# Patient Record
Sex: Male | Born: 1975 | Race: White | Hispanic: No | Smoking: Never smoker
Health system: Southern US, Community
[De-identification: ages and names within clinical notes are randomized; demographics above are authoritative.]

## PROBLEM LIST (undated history)

## (undated) DIAGNOSIS — I10 Essential (primary) hypertension: Secondary | ICD-10-CM

## (undated) DIAGNOSIS — E669 Obesity, unspecified: Secondary | ICD-10-CM

## (undated) DIAGNOSIS — E66811 Obesity, class 1: Secondary | ICD-10-CM

## (undated) DIAGNOSIS — K219 Gastro-esophageal reflux disease without esophagitis: Secondary | ICD-10-CM

## (undated) DIAGNOSIS — F410 Panic disorder [episodic paroxysmal anxiety] without agoraphobia: Secondary | ICD-10-CM

## (undated) HISTORY — DX: Obesity, class 1: E66.811

## (undated) HISTORY — DX: Gastro-esophageal reflux disease without esophagitis: K21.9

## (undated) HISTORY — DX: Panic disorder (episodic paroxysmal anxiety): F41.0

## (undated) HISTORY — DX: Obesity, unspecified: E66.9

## (undated) HISTORY — DX: Essential (primary) hypertension: I10

---

## 2007-05-19 HISTORY — PX: KNEE ARTHROSCOPY: SHX127

## 2021-06-05 DIAGNOSIS — K219 Gastro-esophageal reflux disease without esophagitis: Secondary | ICD-10-CM | POA: Insufficient documentation

## 2021-06-05 DIAGNOSIS — E669 Obesity, unspecified: Secondary | ICD-10-CM | POA: Insufficient documentation

## 2021-06-05 DIAGNOSIS — F411 Generalized anxiety disorder: Secondary | ICD-10-CM | POA: Insufficient documentation

## 2021-06-05 DIAGNOSIS — I10 Essential (primary) hypertension: Secondary | ICD-10-CM | POA: Insufficient documentation

## 2021-06-05 DIAGNOSIS — F410 Panic disorder [episodic paroxysmal anxiety] without agoraphobia: Secondary | ICD-10-CM | POA: Insufficient documentation

## 2021-06-10 ENCOUNTER — Encounter: Payer: Self-pay | Admitting: Cardiology

## 2021-06-10 ENCOUNTER — Other Ambulatory Visit: Payer: Self-pay

## 2021-06-10 ENCOUNTER — Ambulatory Visit: Payer: BC Managed Care – PPO | Admitting: Cardiology

## 2021-06-10 VITALS — BP 148/108 | HR 94 | Ht 68.0 in | Wt 245.0 lb

## 2021-06-10 DIAGNOSIS — R0602 Shortness of breath: Secondary | ICD-10-CM

## 2021-06-10 DIAGNOSIS — Z8249 Family history of ischemic heart disease and other diseases of the circulatory system: Secondary | ICD-10-CM

## 2021-06-10 DIAGNOSIS — R072 Precordial pain: Secondary | ICD-10-CM | POA: Diagnosis not present

## 2021-06-10 DIAGNOSIS — I10 Essential (primary) hypertension: Secondary | ICD-10-CM

## 2021-06-10 DIAGNOSIS — E782 Mixed hyperlipidemia: Secondary | ICD-10-CM | POA: Diagnosis not present

## 2021-06-10 MED ORDER — ASPIRIN EC 81 MG PO TBEC: 81.0000 mg | DELAYED_RELEASE_TABLET | Freq: Every day | ORAL | 3 refills | Status: DC

## 2021-06-10 MED ORDER — METOPROLOL TARTRATE 100 MG PO TABS: 100.0000 mg | ORAL_TABLET | Freq: Once | ORAL | 0 refills | Status: DC

## 2021-06-10 NOTE — Progress Notes (Signed)
Cardiology Office Note:    Date:  06/10/2021   ID:  Roger Vance, DOB [birth year], MRN [id number]  PCP:  Julianne Handler, NP  Cardiologist:  Norman Herrlich, MD   Referring MD: Julianne Handler, NP  ASSESSMENT:    1. Precordial pain   2. SOB (shortness of breath) on exertion   3. Essential hypertension   4. Mixed hyperlipidemia   5. Family history of premature CAD    PLAN:    In order of problems listed above:  Although his pretest probability of CAD by traditional ASCVD risk calculator is low it does not incorporate his family history.  His symptoms seem to be typical anginal I think he should have further evaluation including echocardiogram for cardio myopathy although he does not have a murmur and cardiac CTA for CAD and calcium score.  He will also have evaluation of his lungs on CT scan. Continue his current antihypertensives he is quite hypertensive today but did not take his medication he received a beta-blocker for the CT scan  Continue lipid-lowering likely will need a second agent along with statin in a male population and use prescription strength fish oil Lovaza.  If CAD is significant icosapent ethyl  Next appointment 8 weeks    Medication Adjustments/Labs and Tests Ordered: Current medicines are reviewed at length with the patient today.  Concerns regarding medicines are outlined above.  Orders Placed This Encounter  Procedures   CT CORONARY MORPH W/CTA COR W/SCORE W/CA W/CM &/OR WO/CM   Basic metabolic panel   EKG 12-Lead   ECHOCARDIOGRAM COMPLETE   Meds ordered this encounter  Medications   aspirin EC 81 MG tablet    Sig: Take 1 tablet (81 mg total) by mouth daily. Swallow whole.    Dispense:  90 tablet    Refill:  3   metoprolol tartrate (LOPRESSOR) 100 MG tablet    Sig: Take 1 tablet (100 mg total) by mouth once for 1 dose. Take two hours prior to your cardiac CT    Dispense:  1 tablet    Refill:  0     Chief Complaint  Patient presents with   Shortness  of Breath    History of Present Illness:    Roger Vance is a 46 y.o. male with a history of hypertension dyslipidemia currently not on lipid-lowering treatment and anxiety disorder who is being seen today for the evaluation of exertional shortness of breath at the request of Julianne Handler, NP.  Recent labs 04/09/2020 hemoglobin 16.9 platelets 224,000 GFR greater than 60 cc creatinine 0.9 sodium 140 potassium 3.9 TSH normal 1.40 cholesterol 192 LDL 106 triglycerides 241 HDL 38  For the last 6 months he has been having exertional symptoms It does not happen all the time but he does activities like going up stairs at times he will get a fullness in his throat goes into his jaw very short of breath forces him to stop and rest last for about 30 seconds Does not occur at rest does not occur nocturnally. And totally set 7 rate episodes He does not have asthma does not cough or wheeze No edema orthopnea palpitation or syncope  He recently started on a statin He has been on antihypertensive agents for short period  Is ASCVD risk score is low 2.6 however he has a family history of premature CAD with father myocardial infarction age 78 and a strong family history of heart disease in his father's family He has no  known history of congenital rheumatic heart disease but was told in 2006 that he had mitral valve prolapse and an echocardiogram in [place] He has had no fevers or chills He is a non-smoker and no occupational lung exposures  Past Medical History:  Diagnosis Date   Class 1 obesity    Essential hypertension    Generalized anxiety disorder with panic attacks    GERD without esophagitis     Past Surgical History:  Procedure Laterality Date   KNEE ARTHROSCOPY Left 2009    Current Medications: Current Meds  Medication Sig   ALPRAZolam (XANAX XR) 0.5 MG 24 hr tablet Take 0.5 mg by mouth daily as needed for anxiety.   aspirin EC 81 MG tablet Take 1 tablet (81 mg total) by mouth  daily. Swallow whole.   cloNIDine (CATAPRES) 0.1 MG tablet Take 0.1 mg by mouth daily as needed. SBP > 180   OR   DBP > 100   EPINEPHrine 0.3 mg/0.3 mL IJ SOAJ injection Inject 0.3 mg into the muscle as needed for anaphylaxis.   escitalopram (LEXAPRO) 10 MG tablet Take 20 mg by mouth daily.   lisinopril-hydrochlorothiazide (ZESTORETIC) 10-12.5 MG tablet Take 1 tablet by mouth daily.   metoprolol tartrate (LOPRESSOR) 100 MG tablet Take 1 tablet (100 mg total) by mouth once for 1 dose. Take two hours prior to your cardiac CT   omeprazole (PRILOSEC) 40 MG capsule Take 40 mg by mouth at bedtime.   rosuvastatin (CRESTOR) 10 MG tablet Take 10 mg by mouth at bedtime.     Allergies:   Bee venom and Metronidazole   Social History   Socioeconomic History   Marital status: Unknown    Spouse name: Not on file   Number of children: Not on file   Years of education: Not on file   Highest education level: Not on file  Occupational History   Not on file  Tobacco Use   Smoking status: Never    Passive exposure: Past   Smokeless tobacco: Never  Vaping Use   Vaping Use: Never used  Substance and Sexual Activity   Alcohol use: Yes    Comment: Rare   Drug use: Never   Sexual activity: Not on file  Other Topics Concern   Not on file  Social History Narrative   Not on file   Social Determinants of Health   Financial Resource Strain: Not on file  Food Insecurity: Not on file  Transportation Needs: Not on file  Physical Activity: Not on file  Stress: Not on file  Social Connections: Not on file     Family History: The patient's family history includes Diabetes Mellitus I in his paternal grandmother; Healthy in his mother; Heart attack (age of onset: 59) in his father; Hypertension in his father.  ROS:   ROS Please see the history of present illness.     All other systems reviewed and are negative.  EKGs/Labs/Other Studies Reviewed:    The following studies were reviewed  today:   EKG:  EKG is  ordered today.  The ekg ordered today is personally reviewed and demonstrates sinus rhythm and normal   Physical Exam:    VS:  BP (!) 148/106 (BP Location: Left Arm)    Pulse 94    Ht 5\' 8"  (1.727 m)    Wt 245 lb (111.1 kg)    SpO2 100%    BMI 37.25 kg/m     Wt Readings from Last 3 Encounters:  06/10/21 245  lb (111.1 kg)     GEN: Obese BMI exceeds 35 appears healthy well nourished, well developed in no acute distress HEENT: Normal NECK: No JVD; No carotid bruits LYMPHATICS: No lymphadenopathy CARDIAC: RRR, no murmurs, rubs, gallops no click or murmur RESPIRATORY:  Clear to auscultation without rales, wheezing or rhonchi  ABDOMEN: Soft, non-tender, non-distended MUSCULOSKELETAL:  No edema; No deformity  SKIN: Warm and dry NEUROLOGIC:  Alert and oriented x 3 PSYCHIATRIC:  Normal affect     Signed, Norman HerrlichBrian Shemuel Harkleroad, MD  06/10/2021 4:44 PM    [facility]

## 2021-06-10 NOTE — Patient Instructions (Signed)
Medication Instructions:  Your physician has recommended you make the following change in your medication:  START: Aspirin 81 mg take one tablet by mouth daily.  *If you need a refill on your cardiac medications before your next appointment, please call your pharmacy*   Lab Work: Your physician recommends that you return for lab work in: Within one week of your cardiac CT BMP If you have labs (blood work) drawn today and your tests are completely normal, you will receive your results only by: [place] (if you have MyChart) OR A paper copy in the mail If you have any lab test that is abnormal or we need to change your treatment, we will call you to review the results.   Testing/Procedures:   Your cardiac CT will be scheduled at the below location:   [facility] [address] [place], [place] [address] [phone]  If scheduled at [facility], please arrive at the [facility] main entrance (entrance A) of [facility] 30 minutes prior to test start time. You can use the FREE valet parking offered at the main entrance (encouraged to control the heart rate for the test) Proceed to the [facility] Radiology Department (first floor) to check-in and test prep.  Please follow these instructions carefully (unless otherwise directed):   On the Night Before the Test: Be sure to Drink plenty of water. Do not consume any caffeinated/decaffeinated beverages or chocolate 12 hours prior to your test. Do not take any antihistamines 12 hours prior to your test.  On the Day of the Test: Drink plenty of water until 1 hour prior to the test. Do not eat any food 4 hours prior to the test. You may take your regular medications prior to the test.  Take metoprolol (Lopressor) two hours prior to test.       After the Test: Drink plenty of water. After receiving IV contrast, you may experience a mild flushed feeling. This is normal. On occasion, you may  experience a mild rash up to 24 hours after the test. This is not dangerous. If this occurs, you can take Benadryl 25 mg and increase your fluid intake. If you experience trouble breathing, this can be serious. If it is severe call 911 IMMEDIATELY. If it is mild, please call our office. If you take any of these medications: Glipizide/Metformin, Avandament, Glucavance, please do not take 48 hours after completing test unless otherwise instructed.  We will call to schedule your test 2-4 weeks out understanding that some insurance companies will need an authorization prior to the service being performed.   For non-scheduling related questions, please contact the cardiac imaging nurse navigator should you have any questions/concerns: Marchia Bond, Cardiac Imaging Nurse Navigator Gordy Clement, Cardiac Imaging Nurse Navigator [facility] Direct Office Dial: [phone]   For scheduling needs, including cancellations and rescheduling, please call [place], [phone].  Your physician has requested that you have an echocardiogram. Echocardiography is a painless test that uses sound waves to create images of your heart. It provides your doctor with information about the size and shape of your heart and how well your hearts chambers and valves are working. This procedure takes approximately one hour. There are no restrictions for this procedure.    Follow-Up: At [facility], you and your health needs are our priority.  As part of our continuing mission to provide you with exceptional heart care, we have created designated Provider Care Teams.  These Care  Teams include your primary Cardiologist (physician) and Advanced Practice Providers (APPs -  Physician Assistants and Nurse Practitioners) who all work together to provide you with the care you need, when you need it.  We recommend signing up for the patient portal called "MyChart".  Sign up information is provided on  this After Visit Summary.  MyChart is used to connect with patients for Virtual Visits (Telemedicine).  Patients are able to view lab/test results, encounter notes, upcoming appointments, etc.  Non-urgent messages can be sent to your provider as well.   To learn more about what you can do with MyChart, go to [email or web address].    Your next appointment:   8 week(s)  The format for your next appointment:   In Person  Provider:   Shirlee More, MD    Other Instructions

## 2021-06-17 ENCOUNTER — Telehealth (HOSPITAL_COMMUNITY): Payer: Self-pay | Admitting: Emergency Medicine

## 2021-06-17 ENCOUNTER — Other Ambulatory Visit: Payer: Self-pay

## 2021-06-17 DIAGNOSIS — R0602 Shortness of breath: Secondary | ICD-10-CM

## 2021-06-17 NOTE — Telephone Encounter (Signed)
Reaching out to patient to offer assistance regarding upcoming cardiac imaging study; pt verbalizes understanding of appt date/time, parking situation and where to check in, pre-test NPO status and medications ordered, and verified current allergies; name and call back number provided for further questions should they arise Bentlee Drier RN Navigator Cardiac Imaging [place] [facility] [phone] office [phone] cell   100mg metoprolol tartrate  

## 2021-06-18 LAB — BASIC METABOLIC PANEL
BUN/Creatinine Ratio: 19 (ref 9–20)
BUN: 17 mg/dL (ref 6–24)
CO2: 21 mmol/L (ref 20–29)
Calcium: 9.7 mg/dL (ref 8.7–10.2)
Chloride: 102 mmol/L (ref 96–106)
Creatinine, Ser: 0.89 mg/dL (ref 0.76–1.27)
Glucose: 96 mg/dL (ref 70–99)
Potassium: 4.1 mmol/L (ref 3.5–5.2)
Sodium: 140 mmol/L (ref 134–144)
eGFR: 108 mL/min/{1.73_m2} (ref >59)

## 2021-06-19 ENCOUNTER — Telehealth (HOSPITAL_COMMUNITY): Payer: Self-pay | Admitting: Emergency Medicine

## 2021-06-19 NOTE — Telephone Encounter (Signed)
Reaching out to patient to offer assistance regarding upcoming cardiac imaging study; pt verbalizes understanding of appt date/time, parking situation and where to check in, pre-test NPO status and medications ordered, and verified current allergies; name and call back number provided for further questions should they arise Adalei Novell RN Navigator Cardiac Imaging [place] [facility] [phone] office [phone] cell  100mg metoprolol  Denies iv issues Arrival 1230  

## 2021-06-20 ENCOUNTER — Other Ambulatory Visit: Payer: Self-pay

## 2021-06-20 ENCOUNTER — Telehealth: Payer: Self-pay

## 2021-06-20 ENCOUNTER — Ambulatory Visit (HOSPITAL_COMMUNITY)
Admission: RE | Admit: 2021-06-20 | Discharge: 2021-06-20 | Disposition: A | Discharge status: Home / Self Care | Payer: BC Managed Care – PPO | Source: Ambulatory Visit | Attending: Cardiology | Admitting: Cardiology

## 2021-06-20 DIAGNOSIS — R072 Precordial pain: Secondary | ICD-10-CM

## 2021-06-20 MED ORDER — IOHEXOL 350 MG/ML SOLN
95.0000 mL | Freq: Once | INTRAVENOUS | Status: AC | PRN
  Administered 2021-06-20: 95 mL via INTRAVENOUS

## 2021-06-20 MED ORDER — CARVEDILOL 6.25 MG PO TABS: 6.2500 mg | ORAL_TABLET | Freq: Two times a day (BID) | ORAL | 3 refills | Status: DC

## 2021-06-20 MED ORDER — NITROGLYCERIN 0.4 MG SL SUBL
0.8000 mg | SUBLINGUAL_TABLET | Freq: Once | SUBLINGUAL | Status: AC
Start: 2021-06-20 — End: 2021-06-20
  Administered 2021-06-20: 0.8 mg via SUBLINGUAL

## 2021-06-20 MED ORDER — NITROGLYCERIN 0.4 MG SL SUBL
SUBLINGUAL_TABLET | SUBLINGUAL | Status: AC
  Filled 2021-06-20: qty 2

## 2021-06-20 NOTE — Telephone Encounter (Signed)
-----   Message from Baldo Daub, MD sent at 06/20/2021  2:57 PM EST ----- His cardiac CTA is reassuring and that his score is 0 no atherosclerosis and normal coronary arteries without blockage  He does have enlargement of the blood vessel in the chest called aortic it is not severe but he will need annual imaging CT and we can do it without contrast.  I really want him to get the echocardiogram done that scheduled  Please ask him if there is any family history of people having enlargement or aneurysm either in the abdomen or chest.  Is also very important his blood pressure is well controlled, I think he would benefit from a beta-blocker and I would like him to start carvedilol 6.25 mg twice daily and send me a list of home blood pressures at least once daily if possible twice in about 2 weeks by MyChart

## 2021-06-20 NOTE — Telephone Encounter (Signed)
Spoke with patient regarding results and recommendation.  Patient verbalizes understanding and is agreeable to plan of care. Advised patient to call back with any issues or concerns.  

## 2021-06-20 NOTE — Progress Notes (Signed)
CT scan completed. Tolerated well. D/C home ambulatory. Awake and alert. In no distress 

## 2021-06-23 ENCOUNTER — Other Ambulatory Visit: Payer: Self-pay

## 2021-06-23 ENCOUNTER — Ambulatory Visit (HOSPITAL_BASED_OUTPATIENT_CLINIC_OR_DEPARTMENT_OTHER)
Admission: RE | Admit: 2021-06-23 | Discharge: 2021-06-23 | Disposition: A | Discharge status: Home / Self Care | Payer: BC Managed Care – PPO | Source: Ambulatory Visit | Attending: Cardiology | Admitting: Cardiology

## 2021-06-23 DIAGNOSIS — R0602 Shortness of breath: Secondary | ICD-10-CM | POA: Diagnosis present

## 2021-06-23 LAB — ECHOCARDIOGRAM COMPLETE
AV Mean grad: 3 mmHg
AV Peak grad: 5.1 mmHg
Ao pk vel: 1.13 m/s
Area-P 1/2: 4.65 cm2
S' Lateral: 2.5 cm

## 2021-07-31 ENCOUNTER — Ambulatory Visit: Payer: BC Managed Care – PPO | Admitting: Cardiology

## 2021-10-01 NOTE — Progress Notes (Signed)
Cardiology Office Note:    Date:  10/02/2021   ID:  TEMESGEN WEIGHTMAN, DOB [birth year], MRN [id number]  PCP:  Julianne Handler, NP  Cardiologist:  Norman Herrlich, MD    Referring MD: Julianne Handler, NP    ASSESSMENT:    1. Ascending aorta enlargement (HCC)   2. Essential hypertension   3. Mixed hyperlipidemia   4. Family history of premature CAD    PLAN:    In order of problems listed above:  He has enlargement ascending aorta in the context of hypertension it is uncommon for this to progress to needing intervention we will plan to recheck a CT no contrast in 1 year and see me in the office afterwards and focus on hypertension control Continue current medical regimen beta-blocker clonidine ACE inhibitor thiazide diuretic and strongly encouraged him for lifestyle changes with activity sodium restriction and weight loss He elects to continue a statin with family history of premature CAD   Next appointment: 1 year   Medication Adjustments/Labs and Tests Ordered: Current medicines are reviewed at length with the patient today.  Concerns regarding medicines are outlined above.  Orders Placed This Encounter  Procedures   CT Chest Wo Contrast   Meds ordered this encounter  Medications   nebivolol (BYSTOLIC) 5 MG tablet    Sig: Take 1 tablet (5 mg total) by mouth daily.    Dispense:  90 tablet    Refill:  3    Chief complaint follow-up for cardiac testing   History of Present Illness:    Roger Vance is a 46 y.o. male with a hx of hypertension dyslipidemia currently not on lipid-lowering treatment and anxiety disorder 06/10/2021 for exertional SOB last seen 06/10/2021.  Compliance with diet, lifestyle and medications: Yes Despite a calcium score of 0 normal coronary arteriography is very worried about enlargement ascending aorta 43 mm. Reviewed with him that 12 normal no male is 40 mm does not have aneurysm generally does not progress we will plan a noncontrast CT in 1 year prior  to seeing me in the office It is important his blood pressure is well controlled in the setting of a start to recheck at home and discussed lifestyle changes including regular physical activity sodium restriction and calcium was 10% of his body weight. He forgets to take a second dose of Cardizem and we will transition to Bystolic continue his other antihypertensives He elects to continue lipid-lowering therapy with statin  Echo 06/23/2021:  1. Left ventricular ejection fraction, by estimation, is 55 to 60%. The left ventricle has normal function. The left ventricle has no regional wall motion abnormalities. There is moderate concentric left ventricular  hypertrophy. Left ventricular diastolic parameters are consistent with Grade I diastolic dysfunction  (impaired relaxation).   2. Right ventricular systolic function is normal. The right ventricular size is normal.   3. The mitral valve is normal in structure. No evidence of mitral valve  regurgitation. No evidence of mitral stenosis.   4. The aortic valve is tricuspid. Aortic valve regurgitation is trivial. No aortic stenosis is present.   5. There is moderate dilatation of the ascending aorta and of the aortic root, measuring 41 mm.   Cardiac CTA 06/20/2021: IMPRESSION: 1. Coronary calcium score of 0. This was 0 percentile for age-, sex, and race-matched controls. 2.  Normal coronary origin with right dominance. 3.  Normal coronary arteries.  CAD RADS 0. Past Medical History:  Diagnosis Date   Class 1 obesity  Essential hypertension    Generalized anxiety disorder with panic attacks    GERD without esophagitis     Past Surgical History:  Procedure Laterality Date   KNEE ARTHROSCOPY Left 2009    Current Medications: Current Meds  Medication Sig   ALPRAZolam (XANAX XR) 0.5 MG 24 hr tablet Take 0.5 mg by mouth daily as needed for anxiety.   aspirin EC 81 MG tablet Take 1 tablet (81 mg total) by mouth daily. Swallow whole.    cloNIDine (CATAPRES) 0.1 MG tablet Take 0.1 mg by mouth daily as needed. SBP > 180   OR   DBP > 100   EPINEPHrine 0.3 mg/0.3 mL IJ SOAJ injection Inject 0.3 mg into the muscle as needed for anaphylaxis.   escitalopram (LEXAPRO) 10 MG tablet Take 20 mg by mouth daily.   lisinopril-hydrochlorothiazide (ZESTORETIC) 10-12.5 MG tablet Take 1 tablet by mouth daily.   nebivolol (BYSTOLIC) 5 MG tablet Take 1 tablet (5 mg total) by mouth daily.   omeprazole (PRILOSEC) 40 MG capsule Take 40 mg by mouth at bedtime.   rosuvastatin (CRESTOR) 10 MG tablet Take 10 mg by mouth at bedtime.   WEGOVY 1 MG/0.5ML SOAJ Inject 0.5 mLs into the skin once a week.   [DISCONTINUED] carvedilol (COREG) 6.25 MG tablet Take 1 tablet (6.25 mg total) by mouth 2 (two) times daily.     Allergies:   Bee venom and Metronidazole   Social History   Socioeconomic History   Marital status: Unknown    Spouse name: Not on file   Number of children: Not on file   Years of education: Not on file   Highest education level: Not on file  Occupational History   Not on file  Tobacco Use   Smoking status: Never    Passive exposure: Past   Smokeless tobacco: Never  Vaping Use   Vaping Use: Never used  Substance and Sexual Activity   Alcohol use: Yes    Comment: Rare   Drug use: Never   Sexual activity: Not on file  Other Topics Concern   Not on file  Social History Narrative   Not on file   Social Determinants of Health   Financial Resource Strain: Not on file  Food Insecurity: Not on file  Transportation Needs: Not on file  Physical Activity: Not on file  Stress: Not on file  Social Connections: Not on file     Family History: The patient's family history includes Diabetes Mellitus I in his paternal grandmother; Healthy in his mother; Heart attack (age of onset: 5154) in his father; Hypertension in his father. ROS:   Please see the history of present illness.    All other systems reviewed and are  negative.  EKGs/Labs/Other Studies Reviewed:    The following studies were reviewed today:   Recent Labs: 06/17/2021: BUN 17; Creatinine, Ser 0.89; Potassium 4.1; Sodium 140  Recent Lipid Panel No results found for: CHOL, TRIG, HDL, CHOLHDL, VLDL, LDLCALC, LDLDIRECT  Physical Exam:    VS:  BP (!) 132/92 (BP Location: Left Arm, Patient Position: Sitting)   Pulse 86   Ht 5\' 8"  (1.727 m)   Wt 233 lb 12.8 oz (106.1 kg)   SpO2 98%   BMI 35.55 kg/m     Wt Readings from Last 3 Encounters:  10/02/21 233 lb 12.8 oz (106.1 kg)  06/10/21 245 lb (111.1 kg)     GEN:  Well nourished, well developed in no acute distress HEENT: Normal NECK: No  JVD; No carotid bruits LYMPHATICS: No lymphadenopathy CARDIAC: RRR, no murmurs, rubs, gallops RESPIRATORY:  Clear to auscultation without rales, wheezing or rhonchi  ABDOMEN: Soft, non-tender, non-distended MUSCULOSKELETAL:  No edema; No deformity  SKIN: Warm and dry NEUROLOGIC:  Alert and oriented x 3 PSYCHIATRIC:  Normal affect    Signed, Norman Herrlich, MD  10/02/2021 11:52 AM    [facility]

## 2021-10-02 ENCOUNTER — Encounter: Payer: Self-pay | Admitting: Cardiology

## 2021-10-02 ENCOUNTER — Ambulatory Visit: Payer: BC Managed Care – PPO | Admitting: Cardiology

## 2021-10-02 VITALS — BP 132/92 | HR 86 | Ht 68.0 in | Wt 233.8 lb

## 2021-10-02 DIAGNOSIS — Z8249 Family history of ischemic heart disease and other diseases of the circulatory system: Secondary | ICD-10-CM | POA: Diagnosis not present

## 2021-10-02 DIAGNOSIS — I7789 Other specified disorders of arteries and arterioles: Secondary | ICD-10-CM | POA: Diagnosis not present

## 2021-10-02 DIAGNOSIS — I10 Essential (primary) hypertension: Secondary | ICD-10-CM

## 2021-10-02 DIAGNOSIS — E782 Mixed hyperlipidemia: Secondary | ICD-10-CM | POA: Diagnosis not present

## 2021-10-02 MED ORDER — NEBIVOLOL HCL 5 MG PO TABS: 5.0000 mg | ORAL_TABLET | Freq: Every day | ORAL | 3 refills | Status: DC

## 2021-10-02 NOTE — Patient Instructions (Signed)
Medication Instructions:  Your physician has recommended you make the following change in your medication:   START: Bystolic 5 mg daily STOP: Carvedilol  *If you need a refill on your cardiac medications before your next appointment, please call your pharmacy*   Lab Work: None If you have labs (blood work) drawn today and your tests are completely normal, you will receive your results only by: MyChart Message (if you have MyChart) OR A paper copy in the mail If you have any lab test that is abnormal or we need to change your treatment, we will call you to review the results.   Testing/Procedures: CT-scan of the chest without contrast (in 1 year see Dr. Dulce Sellar a few weeks later)   Follow-Up: At [facility], you and your health needs are our priority.  As part of our continuing mission to provide you with exceptional heart care, we have created designated Provider Care Teams.  These Care Teams include your primary Cardiologist (physician) and Advanced Practice Providers (APPs -  Physician Assistants and Nurse Practitioners) who all work together to provide you with the care you need, when you need it.  We recommend signing up for the patient portal called "MyChart".  Sign up information is provided on this After Visit Summary.  MyChart is used to connect with patients for Virtual Visits (Telemedicine).  Patients are able to view lab/test results, encounter notes, upcoming appointments, etc.  Non-urgent messages can be sent to your provider as well.   To learn more about what you can do with MyChart, go to [email or web address].    Your next appointment:   1 year(s)  The format for your next appointment:   In Person  Provider:   Norman Herrlich, MD    Other Instructions None  Important Information About Sugar

## 2022-03-17 ENCOUNTER — Telehealth: Payer: Self-pay | Admitting: Cardiology

## 2022-03-17 NOTE — Telephone Encounter (Signed)
Patient is calling requesting Dr. Bettina Gavia write him a letter for medical necessity for a [employer] watch to track his heart so he is able to use it for his HSA account to cover the expenses of it. He states he is needing this letter today due to needing it submitted today. Please advise.

## 2022-03-18 NOTE — Telephone Encounter (Signed)
I need the indication of why you are requesting the pt to get a garmin watch.

## 2022-03-18 NOTE — Telephone Encounter (Signed)
Pt has called back regarding a letter for the device. I do not see any documentation regarding pt needing the device in to write the letter.

## 2022-03-19 NOTE — Telephone Encounter (Signed)
Letter sent via My Chart.

## 2023-02-17 DIAGNOSIS — I517 Cardiomegaly: Secondary | ICD-10-CM

## 2023-02-17 HISTORY — DX: Cardiomegaly: I51.7

## 2023-02-17 NOTE — Progress Notes (Unsigned)
Cardiology Office Note:    Date:  02/18/2023   ID:  Roger Vance, DOB 1975-06-17, MRN [id number]  PCP:  Julianne Handler, NP  Cardiologist:  Norman Herrlich, MD    Referring MD: Julianne Handler, NP    ASSESSMENT:    1. Essential hypertension   2. Mixed hyperlipidemia   3. Ascending aorta enlargement (HCC)    PLAN:    In order of problems listed above:  Markedly improved blood pressure at target continue combination thiazide and ACE inhibitor and vasodilatory beta-blocker Bystolic He declines lipid-lowering therapy at this time and a specific agent for triglycerides He is blood pressure control plan follow-up CT 1 year see me in the office after   Next appointment: 1 year   Medication Adjustments/Labs and Tests Ordered: Current medicines are reviewed at length with the patient today.  Concerns regarding medicines are outlined above.  Orders Placed This Encounter  Procedures   EKG 12-Lead   No orders of the defined types were placed in this encounter.    History of Present Illness:    Roger Vance is a 47 y.o. male with a hx of hypertension dyslipidemia with a coronary calcium score of 0 normal coronary arteriography moderate enlargement of the ascending aorta 43 mm last seen 10/02/2021.  Compliance with diet, lifestyle and medications: Yes  He is quite relieved with results of his coronary calcium score of 0. He had obstructive sleep apnea on CPAP and feels markedly improved His fatigue is resolved He is off semaglutide and his abdominal complaints are resolved He stopped his statin and no longer has muscle pain or weakness Home blood pressure is tightly controlled in the range of 120/84 and he does not require clonidine  We discussed that stat's are optional he prefers not to take it I offered him nonstatin therapy oral Zetia he declined I offered specific treatment for his triglycerides he prefers to try diet and exercise.  Will plan to do a CT of his chest in 1 year  to follow his aorta but I think the key here is control of hypertension And continue to monitor his home blood pressure which is now well-controlled on current meds plus CPAP  No longer having edema shortness of breath chest pain palpitation or syncope  Past Medical History:  Diagnosis Date   Class 1 obesity    Essential hypertension    Generalized anxiety disorder with panic attacks    GERD without esophagitis    LVH (left ventricular hypertrophy) 02/17/2023    Current Medications: Current Meds  Medication Sig   ALPRAZolam (XANAX XR) 0.5 MG 24 hr tablet Take 0.5 mg by mouth daily as needed for anxiety.   cloNIDine (CATAPRES) 0.1 MG tablet Take 0.1 mg by mouth daily as needed. SBP > 180   OR   DBP > 100   EPINEPHrine 0.3 mg/0.3 mL IJ SOAJ injection Inject 0.3 mg into the muscle as needed for anaphylaxis.   escitalopram (LEXAPRO) 10 MG tablet Take 20 mg by mouth daily.   lisinopril-hydrochlorothiazide (ZESTORETIC) 10-12.5 MG tablet Take 1 tablet by mouth daily.   nebivolol (BYSTOLIC) 5 MG tablet Take 1 tablet (5 mg total) by mouth daily.   omeprazole (PRILOSEC) 40 MG capsule Take 40 mg by mouth at bedtime.      EKGs/Labs/Other Studies Reviewed:    The following studies were reviewed today:  Cardiac Studies & Procedures       ECHOCARDIOGRAM  ECHOCARDIOGRAM COMPLETE 06/23/2021  Narrative ECHOCARDIOGRAM REPORT  Patient Name:   Roger Vance Date of Exam: 06/23/2021 Medical Rec #:  [id number]     Height:       68.0 in Accession #:    [id number]    Weight:       245.0 lb Date of Birth:  20-May-1975      BSA:          2.228 m Patient Age:    45 years      BP:           125/93 mmHg Patient Gender: M             HR:           77 bpm. Exam Location:  Inpatient  Procedure: 2D Echo, Cardiac Doppler and Color Doppler  Indications:    Shortness of breath [786.05.ICD-9-CM]  History:        Patient has no prior history of Echocardiogram examinations.  Sonographer:    Roosvelt Maser  RDCS Referring Phys: [id number] Iline Oven [facility]   Sonographer Comments: Suboptimal subcostal window. IMPRESSIONS   1. Left ventricular ejection fraction, by estimation, is 55 to 60%. The left ventricle has normal function. The left ventricle has no regional wall motion abnormalities. There is moderate concentric left ventricular hypertrophy. Left ventricular diastolic parameters are consistent with Grade I diastolic dysfunction (impaired relaxation). 2. Right ventricular systolic function is normal. The right ventricular size is normal. 3. The mitral valve is normal in structure. No evidence of mitral valve regurgitation. No evidence of mitral stenosis. 4. The aortic valve is tricuspid. Aortic valve regurgitation is trivial. No aortic stenosis is present. 5. There is moderate dilatation of the ascending aorta and of the aortic root, measuring 41 mm.  FINDINGS Left Ventricle: Left ventricular ejection fraction, by estimation, is 55 to 60%. The left ventricle has normal function. The left ventricle has no regional wall motion abnormalities. The left ventricular internal cavity size was normal in size. There is moderate concentric left ventricular hypertrophy. Left ventricular diastolic parameters are consistent with Grade I diastolic dysfunction (impaired relaxation). Normal left ventricular filling pressure.  Right Ventricle: The right ventricular size is normal. No increase in right ventricular wall thickness. Right ventricular systolic function is normal.  Left Atrium: Left atrial size was normal in size.  Right Atrium: Right atrial size was normal in size.  Pericardium: There is no evidence of pericardial effusion.  Mitral Valve: The mitral valve is normal in structure. No evidence of mitral valve regurgitation. No evidence of mitral valve stenosis.  Tricuspid Valve: The tricuspid valve is normal in structure. Tricuspid valve regurgitation is trivial. No evidence of tricuspid  stenosis.  Aortic Valve: The aortic valve is tricuspid. Aortic valve regurgitation is trivial. No aortic stenosis is present. Aortic valve mean gradient measures 3.0 mmHg. Aortic valve peak gradient measures 5.1 mmHg.  Pulmonic Valve: The pulmonic valve was normal in structure. Pulmonic valve regurgitation is trivial. No evidence of pulmonic stenosis.  Aorta: The aortic arch was not well visualized. There is moderate dilatation of the ascending aorta and of the aortic root, measuring 41 mm.  Venous: The pulmonary veins were not well visualized.  IAS/Shunts: No atrial level shunt detected by color flow Doppler.   LEFT VENTRICLE PLAX 2D LVIDd:         3.60 cm Diastology LVIDs:         2.50 cm LV e' medial:    7.40 cm/s LV PW:         1.40  cm LV E/e' medial:  10.4 LV IVS:        1.40 cm LV e' lateral:   9.03 cm/s LV E/e' lateral: 8.5   RIGHT VENTRICLE RV Basal diam:  3.40 cm RV S prime:     9.79 cm/s TAPSE (M-mode): 1.7 cm  LEFT ATRIUM           Index        RIGHT ATRIUM           Index LA diam:      3.80 cm 1.71 cm/m   RA Area:     14.60 cm LA Vol (A4C): 33.2 ml 14.90 ml/m  RA Volume:   44.40 ml  19.93 ml/m AORTIC VALVE AV Vmax:           113.00 cm/s AV Vmean:          83.500 cm/s AV VTI:            0.201 m AV Peak Grad:      5.1 mmHg AV Mean Grad:      3.0 mmHg LVOT Vmax:         91.40 cm/s LVOT Vmean:        57.600 cm/s LVOT VTI:          0.170 m LVOT/AV VTI ratio: 0.85  AORTA Ao Root diam: 3.90 cm Ao Asc diam:  4.10 cm  MITRAL VALVE MV Area (PHT): 4.65 cm    SHUNTS MV Decel Time: 163 msec    Systemic VTI: 0.17 m MV E velocity: 77.10 cm/s MV A velocity: 68.60 cm/s MV E/A ratio:  1.12  Norman Herrlich MD Electronically signed by Norman Herrlich MD Signature Date/Time: 06/23/2021/12:19:38 PM    Final     CT SCANS  CT CORONARY MORPH W/CTA COR W/SCORE 06/20/2021  Addendum 06/20/2021  2:47 PM ADDENDUM REPORT: 06/20/2021 14:45  CLINICAL DATA:  Chest  pain  EXAM: Cardiac/Coronary CTA  TECHNIQUE: A non-contrast, gated CT scan was obtained with axial slices of 3 mm through the heart for calcium scoring. Calcium scoring was performed using the Agatston method. A 120 kV prospective, gated, contrast cardiac scan was obtained. Gantry rotation speed was 250 msecs and collimation was 0.6 mm. Two sublingual nitroglycerin tablets (0.8 mg) were given. The 3D data set was reconstructed in 5% intervals of the 35-75% of the R-R cycle. Diastolic phases were analyzed on a dedicated workstation using MPR, MIP, and VRT modes. The patient received 95 cc of contrast.  FINDINGS: Image quality: Excellent.  Noise artifact is: Limited.  Coronary Arteries:  Normal coronary origin.  Right dominance.  Left main: The left main is a large caliber vessel with a normal take off from the left coronary cusp that bifurcates to form a left anterior descending artery and a left circumflex artery. There is no plaque or stenosis.  Left anterior descending artery: The LAD is patent without evidence of plaque or stenosis. The LAD gives off 2 patent diagonal branches.  Left circumflex artery: The LCX is non-dominant and patent with no evidence of plaque or stenosis. The LCX gives off 1 large patent obtuse marginal branch.  Right coronary artery: The RCA is dominant with normal take off from the right coronary cusp. There is no evidence of plaque or stenosis. The RCA terminates as a PDA and right posterolateral branch without evidence of plaque or stenosis.  Right Atrium: Right atrial size is within normal limits.  Right Ventricle: The right ventricular cavity is within normal limits.  Left Atrium: Left atrial size is normal in size with no left atrial appendage filling defect.  Left Ventricle: The ventricular cavity size is within normal limits. There are no stigmata of prior infarction. There is no abnormal filling defect.  Pulmonary arteries: Normal  in size without proximal filling defect.  Pulmonary veins: Normal pulmonary venous drainage.  Pericardium: Normal thickness with no significant effusion or calcium present.  Cardiac valves: The aortic valve is trileaflet without significant calcification. The mitral valve is normal structure without significant calcification.  Aorta: Normal caliber with no significant disease.  Extra-cardiac findings: See attached radiology report for non-cardiac structures.  IMPRESSION: 1. Coronary calcium score of 0. This was 0 percentile for age-, sex, and race-matched controls.  2.  Normal coronary origin with right dominance.  3.  Normal coronary arteries.  CAD RADS 0.  4.  Consider non cardiac causes of chest pain.  RECOMMENDATIONS: 1. CAD-RADS 0: No evidence of CAD (0%). Consider non-atherosclerotic causes of chest pain.  2. CAD-RADS 1: Minimal non-obstructive CAD (0-24%). Consider non-atherosclerotic causes of chest pain. Consider preventive therapy and risk factor modification.  3. CAD-RADS 2: Mild non-obstructive CAD (25-49%). Consider non-atherosclerotic causes of chest pain. Consider preventive therapy and risk factor modification.  4. CAD-RADS 3: Moderate stenosis. Consider symptom-guided anti-ischemic pharmacotherapy as well as risk factor modification per guideline directed care. Additional analysis with CT FFR will be submitted.  5. CAD-RADS 4: Severe stenosis. (70-99% or > 50% left main). Cardiac catheterization or CT FFR is recommended. Consider symptom-guided anti-ischemic pharmacotherapy as well as risk factor modification per guideline directed care. Invasive coronary angiography recommended with revascularization per published guideline statements.  6. CAD-RADS 5: Total coronary occlusion (100%). Consider cardiac catheterization or viability assessment. Consider symptom-guided anti-ischemic pharmacotherapy as well as risk factor modification per guideline  directed care.  7. CAD-RADS N: Non-diagnostic study. Obstructive CAD can't be excluded. Alternative evaluation is recommended.  Armanda Magic, MD   Electronically Signed By: Armanda Magic M.D. On: 06/20/2021 14:45  Narrative EXAM: OVER-READ INTERPRETATION  CT CHEST  The following report is an over-read performed by radiologist Dr. Leanna Battles of [facility] on 06/20/2021. This over-read does not include interpretation of cardiac or coronary anatomy or pathology. The coronary calcium score/coronary CTA interpretation by the cardiologist is attached.  COMPARISON:  None.  FINDINGS: Vascular: Ascending aorta measures approximately 4.3 cm.  Mediastinum/Nodes: None.  Lungs/Pleura: Mild volume loss in the right middle and right lower lobes, adjacent to an elevated right hemidiaphragm. No pleural fluid.  Upper Abdomen: Liver is decreased in attenuation diffusely. Otherwise unremarkable.  Musculoskeletal: Mild degenerative changes in the spine.  IMPRESSION: 1. Ascending aortic aneurysm. Recommend annual imaging followup by CTA or MRA. This recommendation follows 2010 ACCF/AHA/AATS/ACR/ASA/SCA/SCAI/SIR/STS/SVM Guidelines for the Diagnosis and Management of Patients with Thoracic Aortic Disease. Circulation. 2010; 121: X324-M010. Aortic aneurysm NOS (ICD10-I71.9). 2. Hepatic steatosis.  Electronically Signed: By: Leanna Battles M.D. On: 06/20/2021 13:50          EKG Interpretation Date/Time:  Thursday February 18 2023 14:19:55 EDT Ventricular Rate:  82 PR Interval:  170 QRS Duration:  90 QT Interval:  368 QTC Calculation: 429 R Axis:   -5  Text Interpretation: Normal sinus rhythm Normal ECG No previous ECGs available Confirmed by Norman Herrlich ([id number]) on 02/18/2023 2:41:26 PM   Recent Labs: No results found for requested labs within last 365 days.  Recent Lipid Panel No results found for: "CHOL", "TRIG", "HDL", "CHOLHDL", "VLDL", "LDLCALC",  "LDLDIRECT"  Physical  Exam:    VS:  BP 124/80 (BP Location: Right Arm, Patient Position: Sitting, Cuff Size: Normal)   Pulse 82   Ht 5\' 8"  (1.727 m)   Wt 240 lb 9.6 oz (109.1 kg)   SpO2 94%   BMI 36.58 kg/m     Wt Readings from Last 3 Encounters:  02/18/23 240 lb 9.6 oz (109.1 kg)  10/02/21 233 lb 12.8 oz (106.1 kg)  06/10/21 245 lb (111.1 kg)     GEN:  Well nourished, well developed in no acute distress HEENT: Normal NECK: No JVD; No carotid bruits LYMPHATICS: No lymphadenopathy CARDIAC: RRR, no murmurs, rubs, gallops RESPIRATORY:  Clear to auscultation without rales, wheezing or rhonchi  ABDOMEN: Soft, non-tender, non-distended MUSCULOSKELETAL:  No edema; No deformity  SKIN: Warm and dry NEUROLOGIC:  Alert and oriented x 3 PSYCHIATRIC:  Normal affect    Signed, Norman Herrlich, MD  02/18/2023 2:41 PM    [facility]

## 2023-02-18 ENCOUNTER — Ambulatory Visit: Payer: BC Managed Care – PPO | Attending: Cardiology | Admitting: Cardiology

## 2023-02-18 ENCOUNTER — Encounter: Payer: Self-pay | Admitting: Cardiology

## 2023-02-18 VITALS — BP 124/80 | HR 82 | Ht 68.0 in | Wt 240.6 lb

## 2023-02-18 DIAGNOSIS — I10 Essential (primary) hypertension: Secondary | ICD-10-CM

## 2023-02-18 DIAGNOSIS — E782 Mixed hyperlipidemia: Secondary | ICD-10-CM

## 2023-02-18 DIAGNOSIS — I7789 Other specified disorders of arteries and arterioles: Secondary | ICD-10-CM | POA: Diagnosis not present

## 2023-02-18 NOTE — Addendum Note (Signed)
Addended by: Roxanne Mins I on: 02/18/2023 03:04 PM   Modules accepted: Orders

## 2023-02-18 NOTE — Patient Instructions (Signed)
Medication Instructions:  Your physician recommends that you continue on your current medications as directed. Please refer to the Current Medication list given to you today.  *If you need a refill on your cardiac medications before your next appointment, please call your pharmacy*   Lab Work: None If you have labs (blood work) drawn today and your tests are completely normal, you will receive your results only by: MyChart Message (if you have MyChart) OR A paper copy in the mail If you have any lab test that is abnormal or we need to change your treatment, we will call you to review the results.   Testing/Procedures: Non-Cardiac CT scanning, (CAT scanning), is a noninvasive, special x-ray that produces cross-sectional images of the body using x-rays and a computer. CT scans help physicians diagnose and treat medical conditions. For some CT exams, a contrast material is used to enhance visibility in the area of the body being studied. CT scans provide greater clarity and reveal more details than regular x-ray exams.    Follow-Up: At [facility], you and your health needs are our priority.  As part of our continuing mission to provide you with exceptional heart care, we have created designated Provider Care Teams.  These Care Teams include your primary Cardiologist (physician) and Advanced Practice Providers (APPs -  Physician Assistants and Nurse Practitioners) who all work together to provide you with the care you need, when you need it.  We recommend signing up for the patient portal called "MyChart".  Sign up information is provided on this After Visit Summary.  MyChart is used to connect with patients for Virtual Visits (Telemedicine).  Patients are able to view lab/test results, encounter notes, upcoming appointments, etc.  Non-urgent messages can be sent to your provider as well.   To learn more about what you can do with MyChart, go to [email or web address].    Your next  appointment:   1 year(s)  Provider:   Norman Herrlich, MD    Other Instructions Continue to check your blood pressure daily

## 2023-03-11 ENCOUNTER — Telehealth: Payer: Self-pay | Admitting: Cardiology

## 2023-03-11 NOTE — Telephone Encounter (Signed)
*  STAT* If patient is at the pharmacy, call can be transferred to refill team.   1. Which medications need to be refilled? (please list name of each medication and dose if known) nebivolol (BYSTOLIC) 5 MG tablet   2. Which pharmacy/location (including street and city if local pharmacy) is medication to be sent to? [facility] - [place], [place] - [address] Phone: [phone]  Fax: [phone]    3. Do they need a 30 day or 90 day supply? 90

## 2023-03-12 ENCOUNTER — Other Ambulatory Visit: Payer: Self-pay

## 2023-03-12 MED ORDER — NEBIVOLOL HCL 5 MG PO TABS: 5.0000 mg | ORAL_TABLET | Freq: Every day | ORAL | 3 refills | Status: AC

## 2023-03-12 NOTE — Telephone Encounter (Signed)
Patient's Bystolic medication has been re-filled. Patient was informed and verbalized understanding. Patient had no further questions at this time.

## 2023-11-24 IMAGING — CT CT HEART MORP W/ CTA COR W/ SCORE W/ CA W/CM &/OR W/O CM
4 of 7 series · 8 of 20 positions shown, 9 images · IV contrast (APPLIED)
Comparison: None.
COMPARISON: None.

Addendum:
EXAM:
OVER-READ INTERPRETATION  CT CHEST

The following report is an over-read performed by radiologist Dr.
Donovan Gorostieta [[email or web address]] on 06/20/2021. This
over-read does not include interpretation of cardiac or coronary
anatomy or pathology. The coronary calcium score/coronary CTA
interpretation by the cardiologist is attached.
CLINICAL DATA: Chest pain
Cardiac/Coronary CTA
TECHNIQUE: A non-contrast, gated CT scan was obtained with axial slices of 3 mm
through the heart for calcium scoring. Calcium scoring was performed
using the Agatston method. A 120 kV prospective, gated, contrast
cardiac scan was obtained. Gantry rotation speed was 250 msecs and
collimation was 0.6 mm. Two sublingual nitroglycerin tablets (0.8
mg) were given. The 3D data set was reconstructed in 5% intervals of
the 35-75% of the R-R cycle. Diastolic phases were analyzed on a
dedicated workstation using MPR, MIP, and VRT modes. The patient
received 95 cc of contrast.

[Series 6: ts diast sharp · axial · 0.39mm/px · z∈[-209,-172]mm · 2 of 276 slices shown]
[im 92/276  lung]
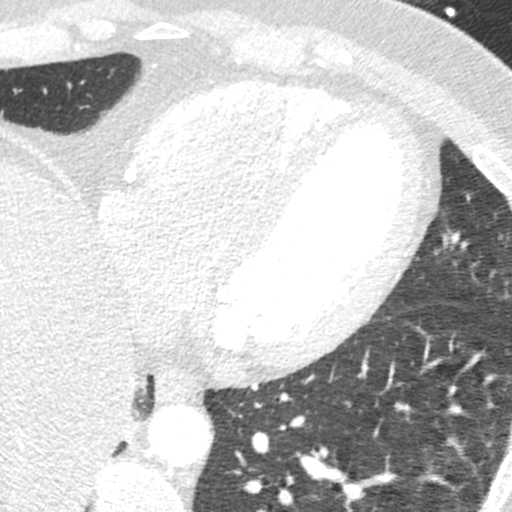
[im 184/276  lung]
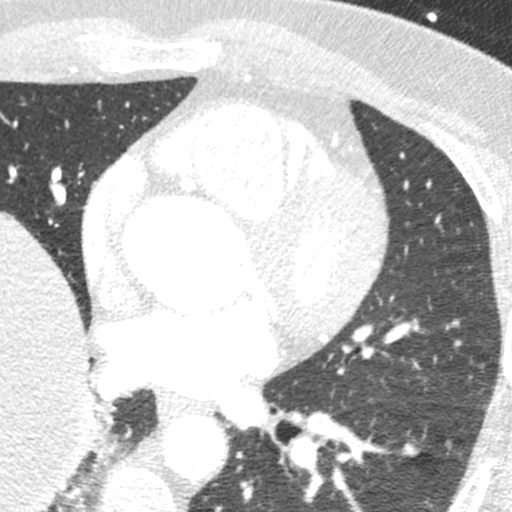

[Series 7: ts syst sharp · axial · 0.39mm/px · z∈[-209,-172]mm · 2 of 276 slices shown]
[im 92/276  lung]
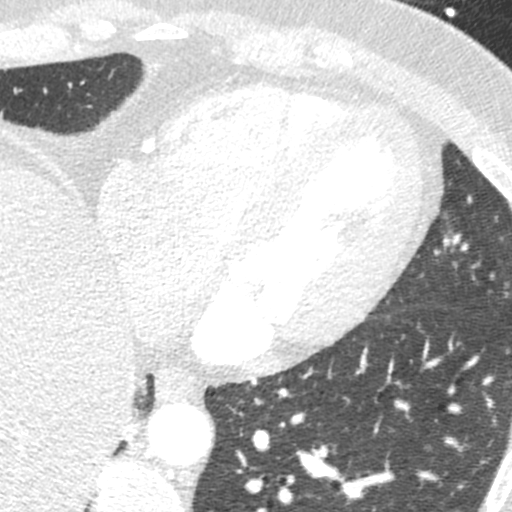
[im 184/276  lung]
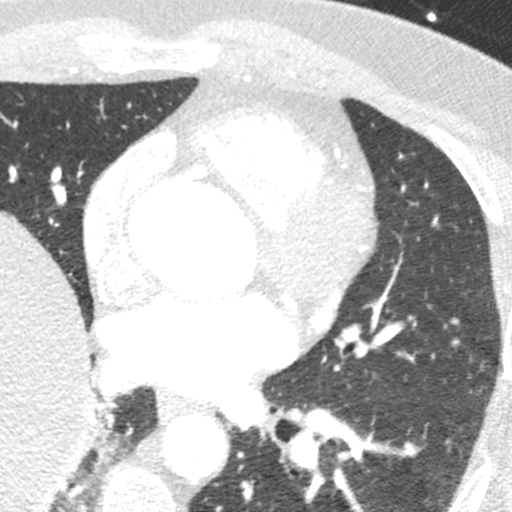

[Series 8: best syst · axial · 0.39mm/px · z∈[-209,-172]mm · 2 of 276 slices shown, 3 images]
[im 92/276  vessel]
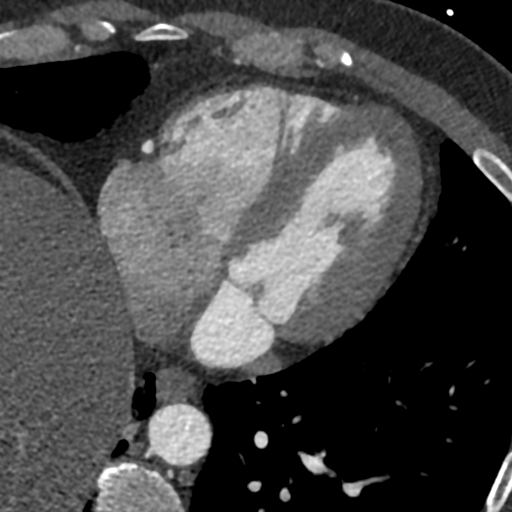
[im 92/276  lung]
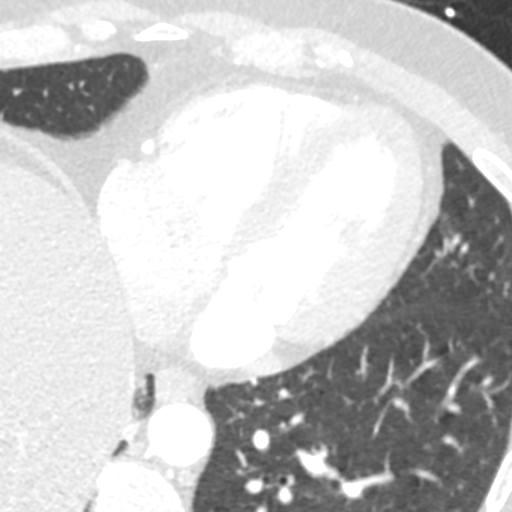
[im 184/276  vessel]
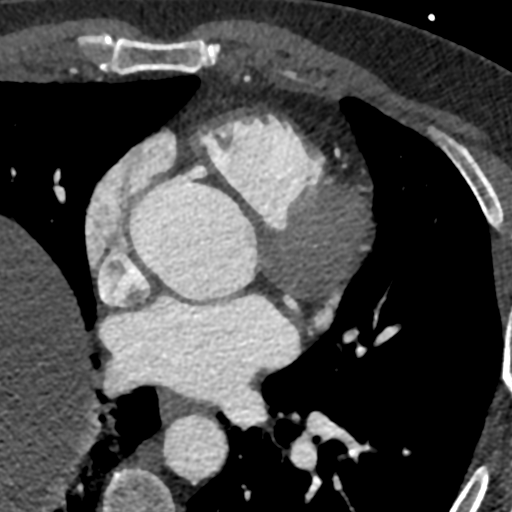

[Series 9: best diast · axial · 0.39mm/px · z∈[-209,-172]mm · 2 of 276 slices shown]
[im 92/276  vessel]
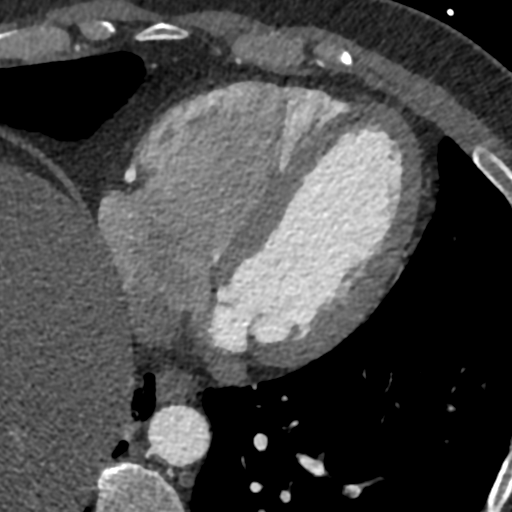
[im 184/276  vessel]
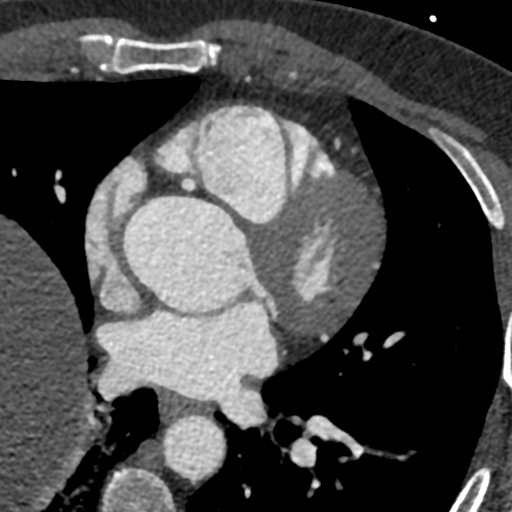

[8 of 20 positions shown; findings below may reference images not displayed]

FINDINGS: Vascular: Ascending aorta measures approximately 4.3 cm.

Mediastinum/Nodes: None.

Lungs/Pleura: Mild volume loss in the right middle and right lower
lobes, adjacent to an elevated right hemidiaphragm. No pleural
fluid.

Upper Abdomen: Liver is decreased in attenuation diffusely.
Otherwise unremarkable.

Musculoskeletal: Mild degenerative changes in the spine.
IMPRESSION: 1. Ascending aortic aneurysm. Recommend annual imaging followup by
CTA or MRA. This recommendation follows 0282
ACCF/AHA/AATS/ACR/ASA/SCA/FERIENHAUS/BAARDT/NERVIN/ADIEL Guidelines for the
Diagnosis and Management of Patients with Thoracic Aortic Disease.
Circulation. 0282; 121: [id number]. Aortic aneurysm NOS
([id number]-0CB.2).
2. Hepatic steatosis.
FINDINGS: Image quality: Excellent.

Noise artifact is: Limited.

Coronary Arteries:  Normal coronary origin.  Right dominance.

Left main: The left main is a large caliber vessel with a normal
take off from the left coronary cusp that bifurcates to form a left
anterior descending artery and a left circumflex artery. There is no
plaque or stenosis.

Left anterior descending artery: The LAD is patent without evidence
of plaque or stenosis. The LAD gives off 2 patent diagonal branches.

Left circumflex artery: The LCX is non-dominant and patent with no
evidence of plaque or stenosis. The LCX gives off 1 large patent
obtuse marginal branch.

Right coronary artery: The RCA is dominant with normal take off from
the right coronary cusp. There is no evidence of plaque or stenosis.
The RCA terminates as a PDA and right posterolateral branch without
evidence of plaque or stenosis.

Right Atrium: Right atrial size is within normal limits.

Right Ventricle: The right ventricular cavity is within normal
limits.

Left Atrium: Left atrial size is normal in size with no left atrial
appendage filling defect.

Left Ventricle: The ventricular cavity size is within normal limits.
There are no stigmata of prior infarction. There is no abnormal
filling defect.

Pulmonary arteries: Normal in size without proximal filling defect.

Pulmonary veins: Normal pulmonary venous drainage.

Pericardium: Normal thickness with no significant effusion or
calcium present.

Cardiac valves: The aortic valve is trileaflet without significant
calcification. The mitral valve is normal structure without
significant calcification.

Aorta: Normal caliber with no significant disease.

Extra-cardiac findings: See attached radiology report for
non-cardiac structures.
IMPRESSION: 1. Coronary calcium score of 0. This was 0 percentile for age-, sex,
and race-matched controls.

2.  Normal coronary origin with right dominance.

3.  Normal coronary arteries.  CAD RADS 0.

4.  Consider non cardiac causes of chest pain.

RECOMMENDATIONS:
1. CAD-RADS 0: No evidence of CAD (0%). Consider non-atherosclerotic
causes of chest pain.

2. CAD-RADS 1: Minimal non-obstructive CAD (0-24%). Consider
non-atherosclerotic causes of chest pain. Consider preventive
therapy and risk factor modification.

3. CAD-RADS 2: Mild non-obstructive CAD (25-49%). Consider
non-atherosclerotic causes of chest pain. Consider preventive
therapy and risk factor modification.

4. CAD-RADS 3: Moderate stenosis. Consider symptom-guided
anti-ischemic pharmacotherapy as well as risk factor modification
per guideline directed care. Additional analysis with CT FFR will be
submitted.

5. CAD-RADS 4: Severe stenosis. (70-99% or > 50% left main). Cardiac
catheterization or CT FFR is recommended. Consider symptom-guided
anti-ischemic pharmacotherapy as well as risk factor modification
per guideline directed care. Invasive coronary angiography
recommended with revascularization per published guideline
statements.

6. CAD-RADS 5: Total coronary occlusion (100%). Consider cardiac
catheterization or viability assessment. Consider symptom-guided
anti-ischemic pharmacotherapy as well as risk factor modification
per guideline directed care.

7. CAD-RADS N: Non-diagnostic study. Obstructive CAD can't be
excluded. Alternative evaluation is recommended.

*** End of Addendum ***
EXAM:
OVER-READ INTERPRETATION  CT CHEST

The following report is an over-read performed by radiologist Dr.
Donovan Gorostieta [REDACTED] on 06/20/2021. This
over-read does not include interpretation of cardiac or coronary
anatomy or pathology. The coronary calcium score/coronary CTA
interpretation by the cardiologist is attached.
FINDINGS: Vascular: Ascending aorta measures approximately 4.3 cm.

Mediastinum/Nodes: None.

Lungs/Pleura: Mild volume loss in the right middle and right lower
lobes, adjacent to an elevated right hemidiaphragm. No pleural
fluid.

Upper Abdomen: Liver is decreased in attenuation diffusely.
Otherwise unremarkable.

Musculoskeletal: Mild degenerative changes in the spine.
IMPRESSION: 1. Ascending aortic aneurysm. Recommend annual imaging followup by
CTA or MRA. This recommendation follows 0282
ACCF/AHA/AATS/ACR/ASA/SCA/FERIENHAUS/BAARDT/NERVIN/ADIEL Guidelines for the
Diagnosis and Management of Patients with Thoracic Aortic Disease.
Circulation. 0282; 121: [id number]. Aortic aneurysm NOS
([id number]-0CB.2).
2. Hepatic steatosis.
